# Patient Record
Sex: Female | Born: 2000 | Race: Black or African American | Hispanic: No | Marital: Single | Smoking: Never smoker
Health system: Southern US, Community
[De-identification: ages and names within clinical notes are randomized; demographics above are authoritative.]

## PROBLEM LIST (undated history)

## (undated) DIAGNOSIS — J45909 Unspecified asthma, uncomplicated: Secondary | ICD-10-CM

## (undated) DIAGNOSIS — F32A Depression, unspecified: Secondary | ICD-10-CM

## (undated) DIAGNOSIS — T7840XA Allergy, unspecified, initial encounter: Secondary | ICD-10-CM

## (undated) DIAGNOSIS — K219 Gastro-esophageal reflux disease without esophagitis: Secondary | ICD-10-CM

## (undated) DIAGNOSIS — R011 Cardiac murmur, unspecified: Secondary | ICD-10-CM

## (undated) DIAGNOSIS — F419 Anxiety disorder, unspecified: Secondary | ICD-10-CM

## (undated) HISTORY — DX: Cardiac murmur, unspecified: R01.1

## (undated) HISTORY — DX: Anxiety disorder, unspecified: F41.9

## (undated) HISTORY — DX: Gastro-esophageal reflux disease without esophagitis: K21.9

## (undated) HISTORY — DX: Depression, unspecified: F32.A

## (undated) HISTORY — DX: Allergy, unspecified, initial encounter: T78.40XA

## (undated) HISTORY — DX: Unspecified asthma, uncomplicated: J45.909

---
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
## 2022-02-08 ENCOUNTER — Encounter: Payer: Self-pay | Admitting: Emergency Medicine

## 2022-02-08 ENCOUNTER — Emergency Department
Admission: EM | Admit: 2022-02-08 | Discharge: 2022-02-08 | Disposition: A | Discharge status: Home / Self Care | Payer: Medicaid Other | Attending: Emergency Medicine | Admitting: Emergency Medicine

## 2022-02-08 DIAGNOSIS — N3001 Acute cystitis with hematuria: Secondary | ICD-10-CM | POA: Diagnosis not present

## 2022-02-08 DIAGNOSIS — R300 Dysuria: Secondary | ICD-10-CM | POA: Diagnosis present

## 2022-02-08 LAB — CHLAMYDIA/NGC RT PCR (ARMC ONLY)
Chlamydia Tr: NOT DETECTED
N gonorrhoeae: NOT DETECTED

## 2022-02-08 LAB — PREGNANCY, URINE: Preg Test, Ur: NEGATIVE

## 2022-02-08 LAB — URINALYSIS, ROUTINE W REFLEX MICROSCOPIC
Bilirubin Urine: NEGATIVE
Glucose, UA: NEGATIVE mg/dL
Ketones, ur: NEGATIVE mg/dL
Nitrite: NEGATIVE
Protein, ur: 100 mg/dL — AB
RBC / HPF: >50 RBC/hpf — ABNORMAL HIGH (ref 0–5)
Specific Gravity, Urine: 1.03 (ref 1.005–1.030)
WBC, UA: >50 WBC/hpf — ABNORMAL HIGH (ref 0–5)
pH: 6 (ref 5.0–8.0)

## 2022-02-08 LAB — POC URINE PREG, ED: Preg Test, Ur: NEGATIVE

## 2022-02-08 MED ORDER — CEPHALEXIN 500 MG PO CAPS
500.0000 mg | ORAL_CAPSULE | Freq: Once | ORAL | Status: AC
  Administered 2022-02-08: 500 mg via ORAL
  Filled 2022-02-08: qty 1

## 2022-02-08 MED ORDER — CEPHALEXIN 500 MG PO CAPS: 500.0000 mg | ORAL_CAPSULE | Freq: Four times a day (QID) | ORAL | 0 refills | Status: AC

## 2022-02-08 NOTE — ED Provider Notes (Signed)
[facility] EMERGENCY DEPARTMENT Provider Note   CSN: [id number] Arrival date & time: 02/08/22  1851     History  Chief Complaint  Patient presents with   Urinary Frequency    Nancy Bullock is a 21 y.o. female.  Presents to the emergency department evaluation of urinary frequency and dysuria.  Symptoms been present x1 day.  No vaginal discharge, fevers, nausea vomiting back pain or belly pain.  She has had a history of UTI in the past, has not been on any recent antibiotics.  She states her symptoms began today and symptoms are mild.  She describes pain with urination and increase in frequency  HPI     Home Medications Prior to Admission medications   Medication Sig Start Date End Date Taking? Authorizing Provider  cephALEXin (KEFLEX) 500 MG capsule Take 1 capsule (500 mg total) by mouth 4 (four) times daily for 5 days. 02/08/22 02/13/22 Yes Evon Slack, PA-C      Allergies    Patient has no known allergies.    Review of Systems   Review of Systems  Physical Exam Updated Vital Signs BP 130/85   Pulse 72   Temp 98.4 F (36.9 C) (Oral)   Resp 20   Ht 5\' 6"  (1.676 m)   Wt 73 kg   SpO2 98%   BMI 25.98 kg/m  Physical Exam Constitutional:      Appearance: She is well-developed.  HENT:     Head: Normocephalic and atraumatic.  Eyes:     Conjunctiva/sclera: Conjunctivae normal.  Cardiovascular:     Rate and Rhythm: Normal rate.  Pulmonary:     Effort: Pulmonary effort is normal. No respiratory distress.  Abdominal:     General: Abdomen is flat. There is no distension.     Tenderness: There is no abdominal tenderness. There is no right CVA tenderness, left CVA tenderness or guarding.  Musculoskeletal:        General: Normal range of motion.     Cervical back: Normal range of motion.  Skin:    General: Skin is warm.     Findings: No rash.  Neurological:     General: No focal deficit present.     Mental Status: She is alert and oriented  to person, place, and time. Mental status is at baseline.  Psychiatric:        Mood and Affect: Mood normal.        Behavior: Behavior normal.        Thought Content: Thought content normal.     ED Results / Procedures / Treatments   Labs (all labs ordered are listed, but only abnormal results are displayed) Labs Reviewed  URINALYSIS, ROUTINE W REFLEX MICROSCOPIC - Abnormal; Notable for the following components:      Result Value   Color, Urine YELLOW (*)    APPearance CLOUDY (*)    Hgb urine dipstick MODERATE (*)    Protein, ur 100 (*)    Leukocytes,Ua SMALL (*)    RBC / HPF >50 (*)    WBC, UA >50 (*)    Bacteria, UA RARE (*)    Non Squamous Epithelial PRESENT (*)    All other components within normal limits  CHLAMYDIA/NGC RT PCR ([facility] ONLY)            PREGNANCY, URINE  POC URINE PREG, ED    EKG None  Radiology No results found.  Procedures Procedures    Medications Ordered in ED Medications  cephALEXin (KEFLEX) capsule 500 mg (has no administration in time range)    ED Course/ Medical Decision Making/ A&P                           Medical Decision Making Risk Prescription drug management.   21 year old female with uncomplicated urinary tract infection.  No abdominal pain fevers or back pain.  No nausea or vomiting.  Vital signs are stable with no fever or tachycardia.  She is doing well.  No discomfort.  She will take antibiotics as prescribed and understands signs symptoms return to ER for. Final Clinical Impression(s) / ED Diagnoses Final diagnoses:  Acute cystitis with hematuria    Rx / DC Orders ED Discharge Orders          Ordered    cephALEXin (KEFLEX) 500 MG capsule  4 times daily        02/08/22 2103              Ronnette Juniper 02/08/22 2104    Georga Hacking, MD 02/09/22 [phone]

## 2022-02-08 NOTE — ED Triage Notes (Signed)
Pt presents via POV with complaints of urinary frequency and burning with urination that started today. Endorses bladder pain 8/10 that is worse when peeing. Pt denies hematuria or fevers.

## 2022-02-08 NOTE — ED Provider Triage Note (Signed)
Emergency Medicine Provider Triage Evaluation Note  Nancy Bullock , a 21 y.o. female  was evaluated in triage.  Pt complains of burning with urination. Also reports urinary urgency/frequency. No vaginal discharge. No abd pain or flank pain. No fever/chills. Reports not worried about STD  Review of Systems  Positive: dysuria Negative: Abd pain, fever, discharge   Physical Exam  Ht 5\' 6"  (1.676 m)   Wt 73 kg   BMI 25.98 kg/m  Gen:   Awake, no distress   Resp:  Normal effort  MSK:   Moves extremities without difficulty  Other:    Medical Decision Making  Medically screening exam initiated at 7:50 PM.  Appropriate orders placed.  Nancy Bullock was informed that the remainder of the evaluation will be completed by another provider, this initial triage assessment does not replace that evaluation, and the importance of remaining in the ED until their evaluation is complete.     , PA-C 02/08/22 1954

## 2022-02-08 NOTE — Discharge Instructions (Signed)
Please take antibiotics as prescribed.  Make sure you are drinking lots of fluids.  Return to the ER for any fevers increasing pain nausea vomiting back pain or any urgent changes in your health

## 2022-02-08 NOTE — ED Notes (Signed)
See triage note. Pt alert, denies fever, resp reg/unlabored, skin dry, sitting calmly on stretcher.

## 2022-08-31 IMAGING — DX DG KNEE COMPLETE 4+V*L*
4 series · 4 of 4 positions shown · non-contrast
Comparison: None.

CLINICAL DATA: Left knee pain

EXAM:
LEFT KNEE - COMPLETE 4+ VIEW

[knee ap]
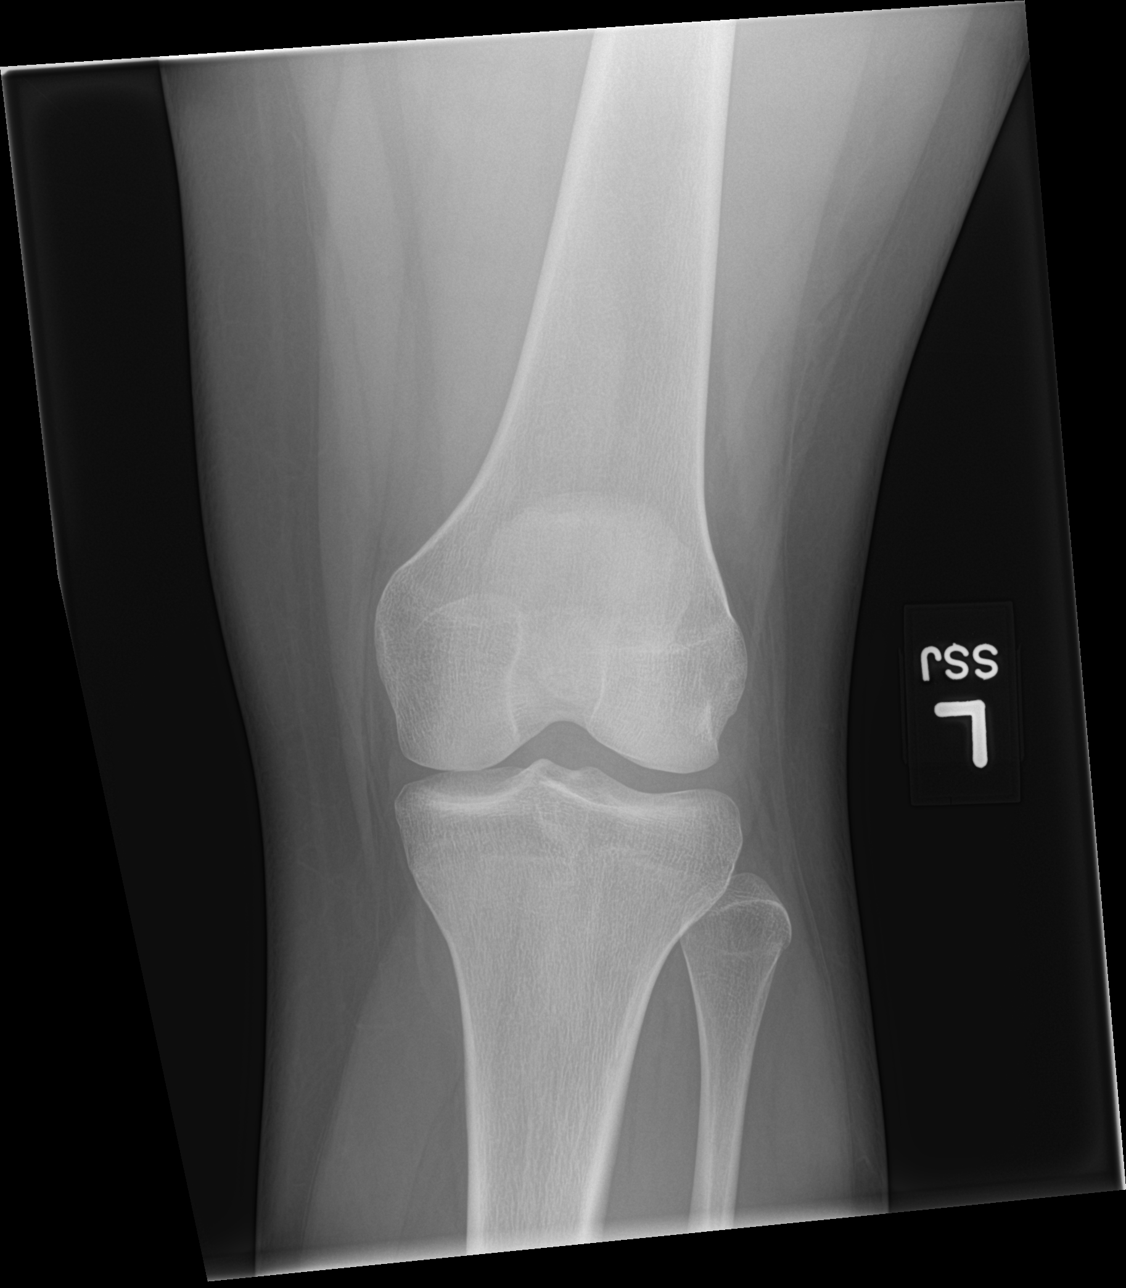

[knee lat]
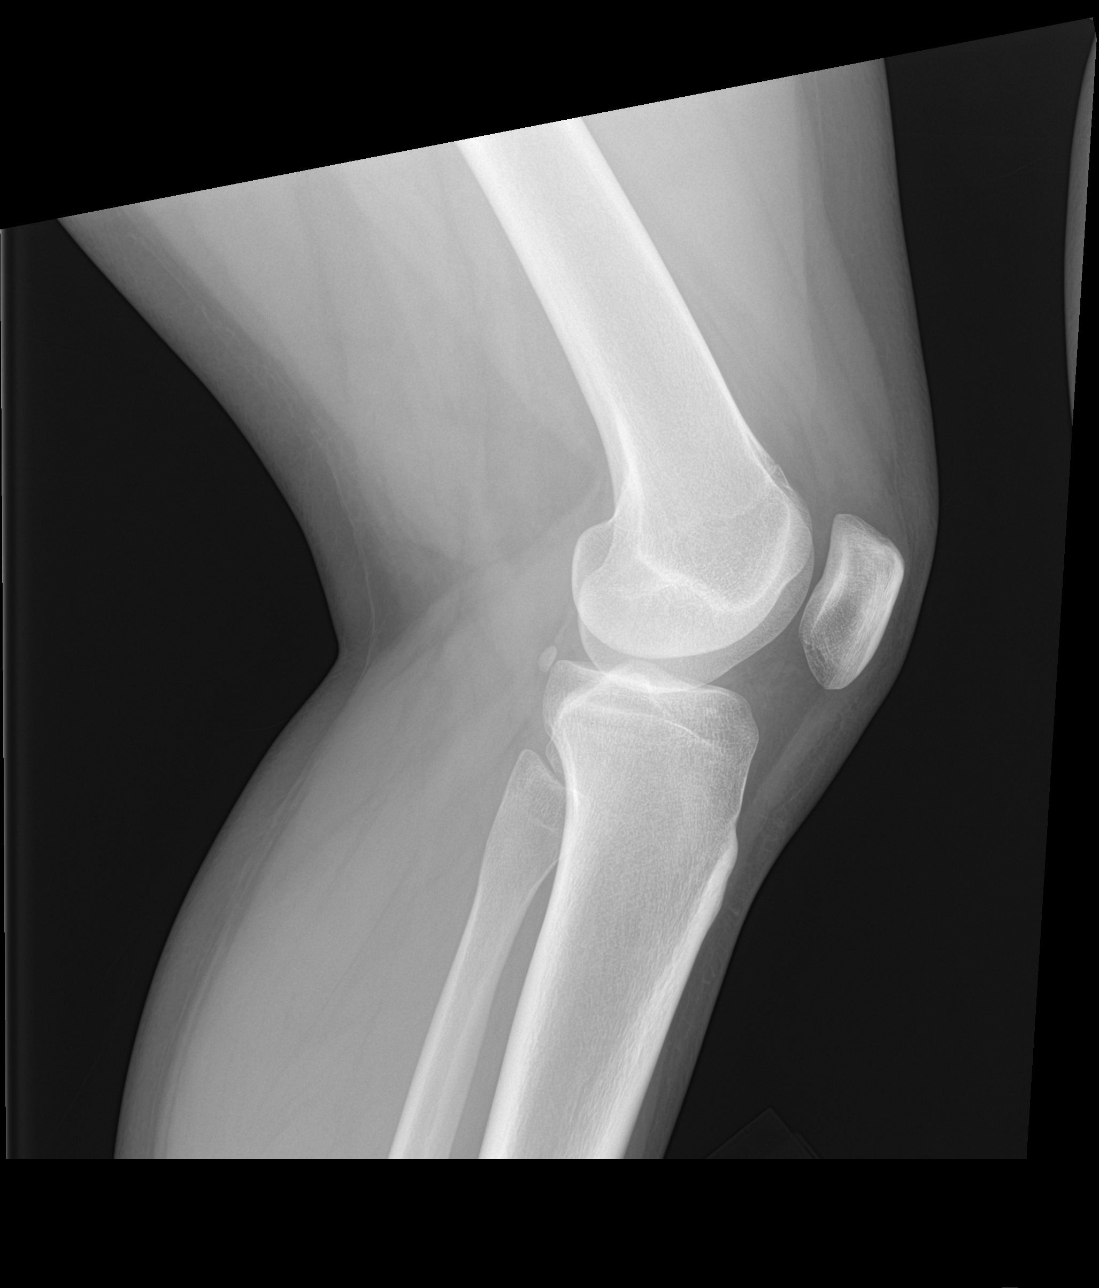

[knee obl (1 of 2)]
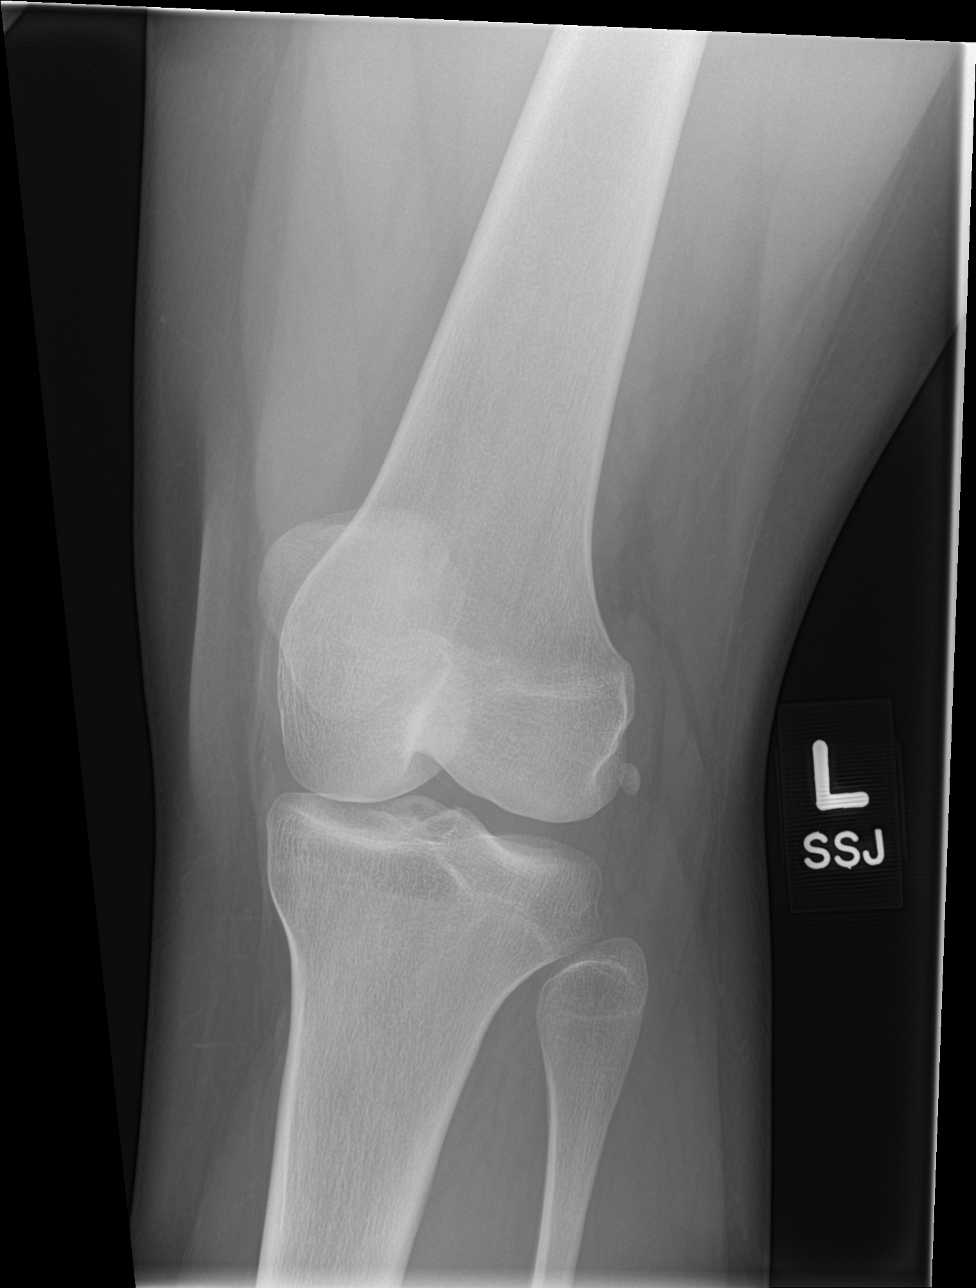

[knee obl (2 of 2)]
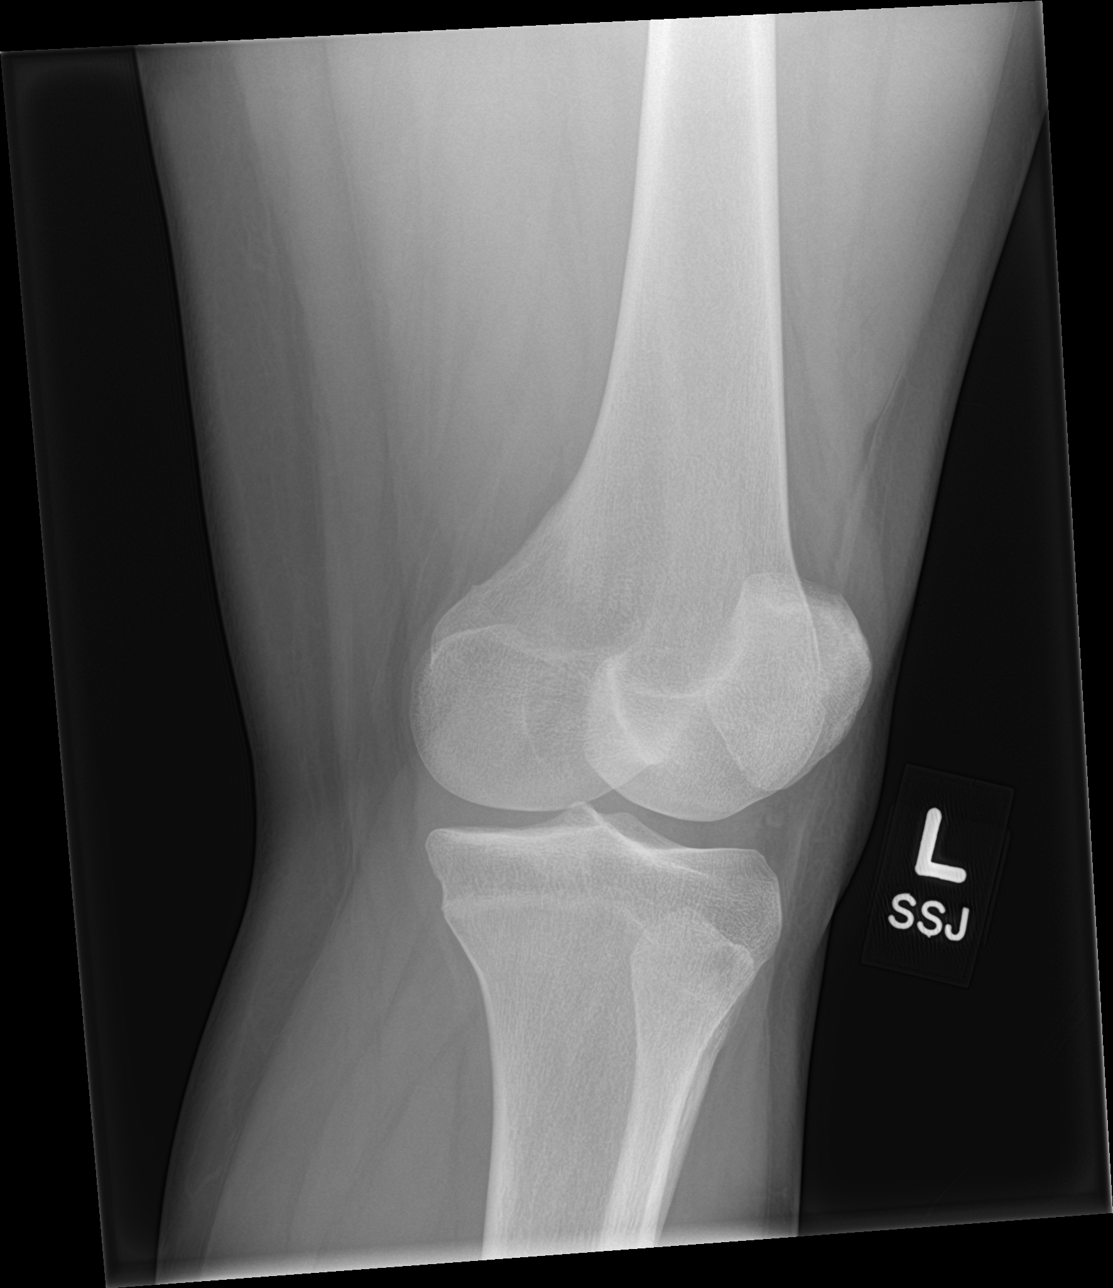

[4 of 4 positions shown; findings below may reference images not displayed]

FINDINGS: No evidence of fracture, dislocation, or joint effusion. No evidence
of arthropathy or other focal bone abnormality. Soft tissues are
unremarkable.
IMPRESSION: Negative.

## 2023-02-10 ENCOUNTER — Emergency Department
Admission: EM | Admit: 2023-02-10 | Discharge: 2023-02-10 | Disposition: A | Discharge status: Home / Self Care | Payer: Medicaid Other | Attending: Student | Admitting: Student

## 2023-02-10 ENCOUNTER — Other Ambulatory Visit: Payer: Self-pay

## 2023-02-10 DIAGNOSIS — Y93G3 Activity, cooking and baking: Secondary | ICD-10-CM | POA: Insufficient documentation

## 2023-02-10 DIAGNOSIS — W260XXA Contact with knife, initial encounter: Secondary | ICD-10-CM | POA: Insufficient documentation

## 2023-02-10 DIAGNOSIS — S61211A Laceration without foreign body of left index finger without damage to nail, initial encounter: Secondary | ICD-10-CM | POA: Insufficient documentation

## 2023-02-10 MED ORDER — LIDOCAINE HCL (PF) 1 % IJ SOLN
5.0000 mL | Freq: Once | INTRAMUSCULAR | Status: AC
  Administered 2023-02-10: 5 mL via INTRADERMAL
  Filled 2023-02-10: qty 5

## 2023-02-10 NOTE — ED Provider Notes (Signed)
[facility] Provider Note    None    (approximate)   History   Laceration (Patient was cutting onions and accidentally cut her LEFT finger with the knife; Bleeding controlled and wrapped in triage, CNS intact)   HPI  Nancy Bullock is a 22 y.o. female with no PMH who presents for evaluation of a laceration to her left index finger.  Patient was cutting an onion when the knife slipped cutting her finger.  Patient states she rinsed it under hot water.  She has pain with movement of the finger.  Last tetanus shot was within 5 years.      Physical Exam   Triage Vital Signs: ED Triage Vitals  Encounter Vitals Group     BP 02/10/23 2021 (!) 168/90     Systolic BP Percentile --      Diastolic BP Percentile --      Pulse Rate 02/10/23 2021 88     Resp 02/10/23 2021 19     Temp 02/10/23 2021 98.6 F (37 C)     Temp Source 02/10/23 2021 Oral     SpO2 02/10/23 2021 100 %     Weight 02/10/23 2022 200 lb (90.7 kg)     Height 02/10/23 2022 5\' 6"  (1.676 m)     Head Circumference --      Peak Flow --      Pain Score 02/10/23 2019 9     Pain Loc --      Pain Education --      Exclude from Growth Chart --     Most recent vital signs: Vitals:   02/10/23 2021  BP: (!) 168/90  Pulse: 88  Resp: 19  Temp: 98.6 F (37 C)  SpO2: 100%    General: Awake, no distress.  CV:  Good peripheral perfusion.  Resp:  Normal effort.  Abd:  No distention.  Other:  2 cm U-shaped laceration to the base of the left index finger on the radial side, bleeding controlled.  ROM of finger maintained.  Neurovascularly intact.   ED Results / Procedures / Treatments   Labs (all labs ordered are listed, but only abnormal results are displayed) Labs Reviewed - No data to display   PROCEDURES:  Critical Care performed: No  ..Laceration Repair  Date/Time: 02/10/2023 9:37 PM  Performed by: Cameron Ali, PA-C Authorized by: Cameron Ali, PA-C   Consent:     Consent obtained:  Verbal   Consent given by:  Patient   Risks discussed:  Infection, pain and poor cosmetic result   Alternatives discussed:  No treatment Anesthesia:    Anesthesia method:  Local infiltration   Local anesthetic:  Lidocaine 1% w/o epi Laceration details:    Location:  Finger   Finger location:  L index finger   Length (cm):  2   Depth (mm):  2 Pre-procedure details:    Preparation:  Patient was prepped and draped in usual sterile fashion Exploration:    Hemostasis achieved with:  Direct pressure Treatment:    Area cleansed with:  Povidone-iodine and saline   Amount of cleaning:  Standard   Irrigation solution:  Sterile saline   Irrigation volume:  20 mL   Irrigation method:  Syringe Skin repair:    Repair method:  Sutures   Suture size:  5-0   Suture material:  Nylon   Suture technique:  Simple interrupted   Number of sutures:  3 Approximation:    Approximation:  Close Repair type:    Repair type:  Simple Post-procedure details:    Dressing:  Adhesive bandage   Procedure completion:  Tolerated well, no immediate complications    MEDICATIONS ORDERED IN ED: Medications  lidocaine (PF) (XYLOCAINE) 1 % injection 5 mL (5 mLs Intradermal Given by Other 02/10/23 2106)     IMPRESSION / MDM / ASSESSMENT AND PLAN / ED COURSE  I reviewed the triage vital signs and the nursing notes.                             22 year old female with laceration to the left index finger.  BP was elevated in triage, otherwise VSS.  NAD on exam.  Differential diagnosis includes, but is not limited to, laceration, tendon injury.  Patient's presentation is most consistent with acute, uncomplicated illness.  Laceration repaired as described in the procedure note above.  I advised patient on wound care.  She can take Tylenol and ibuprofen as needed for pain.  We discussed signs of infection to look for.  Patient voiced understanding, all questions were answered and she stable at  discharge.    FINAL CLINICAL IMPRESSION(S) / ED DIAGNOSES   Final diagnoses:  Laceration of left index finger without foreign body without damage to nail, initial encounter     Rx / DC Orders   ED Discharge Orders     None        Note:  This document was prepared using Dragon voice recognition software and may include unintentional dictation errors.   Cameron Ali, PA-C 02/10/23 2140    Minna Antis, MD 02/10/23 [phone]

## 2023-02-10 NOTE — ED Triage Notes (Signed)
Patient was cutting onions and accidentally cut her LEFT finger with the knife; Bleeding controlled and wrapped in triage, CNS intact

## 2023-02-10 NOTE — Discharge Instructions (Addendum)
You will need to have the stitches removed in 7 to 10 days, this can be done in the ED, by your primary care provider or by urgent care.  Please read the attached information about laceration care.  You should wash the wound with soap and water daily and change her bandage daily.  Please keep it covered with a Band-Aid until you have the stitches removed.  Watch for signs of infection including warmth, redness, swelling and purulent drainage.  Should this occur please be seen by a medical provider.  You can take 650 mg of Tylenol and 600 mg of ibuprofen every 6 hours as needed for pain.

## 2023-05-13 ENCOUNTER — Ambulatory Visit: Payer: Medicaid Other | Admitting: Physician Assistant

## 2023-05-13 ENCOUNTER — Encounter: Payer: Self-pay | Admitting: Physician Assistant

## 2023-05-13 VITALS — BP 124/89 | HR 80 | Temp 98.6°F | Ht 66.0 in | Wt 200.0 lb

## 2023-05-13 DIAGNOSIS — F419 Anxiety disorder, unspecified: Secondary | ICD-10-CM

## 2023-05-13 DIAGNOSIS — E669 Obesity, unspecified: Secondary | ICD-10-CM

## 2023-05-13 DIAGNOSIS — K219 Gastro-esophageal reflux disease without esophagitis: Secondary | ICD-10-CM

## 2023-05-13 DIAGNOSIS — N926 Irregular menstruation, unspecified: Secondary | ICD-10-CM

## 2023-05-13 DIAGNOSIS — Z7689 Persons encountering health services in other specified circumstances: Secondary | ICD-10-CM

## 2023-05-13 DIAGNOSIS — Z833 Family history of diabetes mellitus: Secondary | ICD-10-CM

## 2023-05-13 DIAGNOSIS — F32A Depression, unspecified: Secondary | ICD-10-CM | POA: Diagnosis not present

## 2023-05-13 MED ORDER — OMEPRAZOLE 20 MG PO CPDR: 20.0000 mg | DELAYED_RELEASE_CAPSULE | Freq: Every day | ORAL | 3 refills | Status: AC

## 2023-05-13 MED ORDER — FLUOXETINE HCL 20 MG PO CAPS: 20.0000 mg | ORAL_CAPSULE | Freq: Every day | ORAL | 3 refills | Status: AC
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
## 2023-05-15 ENCOUNTER — Ambulatory Visit: Payer: Medicaid Other | Admitting: Certified Nurse Midwife

## 2023-05-15 ENCOUNTER — Encounter: Payer: Self-pay | Admitting: Certified Nurse Midwife

## 2023-05-15 VITALS — BP 116/76 | HR 81 | Ht 66.0 in | Wt 200.7 lb

## 2023-05-15 DIAGNOSIS — N926 Irregular menstruation, unspecified: Secondary | ICD-10-CM | POA: Diagnosis not present

## 2023-05-15 DIAGNOSIS — Z3009 Encounter for other general counseling and advice on contraception: Secondary | ICD-10-CM

## 2023-05-15 DIAGNOSIS — N939 Abnormal uterine and vaginal bleeding, unspecified: Secondary | ICD-10-CM

## 2023-05-15 LAB — POCT URINE PREGNANCY: Preg Test, Ur: NEGATIVE

## 2023-05-15 NOTE — Progress Notes (Signed)
GYN ENCOUNTER NOTE  Subjective:       Nancy Bullock is a 22 y.o. No obstetric history on file. female is here for gynecologic evaluation of the following issues:  1. Irregular cycles.  Pt state she has long periods lasting up to 15 days and she sometimes has 2 periods a month. She notes that she will pass penny size clots on occasion. She denies that they are painful.    Gynecologic History No LMP recorded. Contraception: none Last Pap: n/a.  Last mammogram: n/a   Obstetric History OB History  No obstetric history on file.    Past Medical History:  Diagnosis Date   Allergy    Anxiety 2021   Asthma    had it [facility] [employer] [facility] but jot as much recently   Depression 2021   GERD (gastroesophageal reflux disease)    ever since i was a kid   Heart murmur    had it when i was a baby    No past surgical history on file.  Current Outpatient Medications on File Prior to Visit  Medication Sig Dispense Refill   FLUoxetine (PROZAC) 20 MG capsule Take 1 capsule (20 mg total) by mouth daily. 30 capsule 3   omeprazole (PRILOSEC) 20 MG capsule Take 1 capsule (20 mg total) by mouth daily. 30 capsule 3   No current facility-administered medications on file prior to visit.    Allergies  Allergen Reactions   Coconut (Cocos Nucifera) Anaphylaxis   Charcoal Dermatitis    Social History   Socioeconomic History   Marital status: Single    Spouse name: Not on file   Number of children: Not on file   Years of education: Not on file   Highest education level: 11th grade  Occupational History   Not on file  Tobacco Use   Smoking status: Never   Smokeless tobacco: Never  Substance and Sexual Activity   Alcohol use: Not Currently   Drug use: Never   Sexual activity: Yes    Birth control/protection: None    Comment: need a new one  Other Topics Concern   Not on file  Social History Narrative   Not on file   Social Determinants of Health   Financial Resource  Strain: Low Risk  (05/09/2023)   Overall Financial Resource Strain (CARDIA)    Difficulty of Paying Living Expenses: Not very hard  Food Insecurity: No Food Insecurity (05/13/2023)   Hunger Vital Sign    Worried About Running Out of Food in the Last Year: Never true    Ran Out of Food in the Last Year: Never true  Transportation Needs: No Transportation Needs (05/09/2023)   PRAPARE - Administrator, Civil Service (Medical): No    Lack of Transportation (Non-Medical): No  Physical Activity: Inactive (05/09/2023)   Exercise Vital Sign    Days of Exercise per Week: 1 day    Minutes of Exercise per Session: 0 min  Stress: Stress Concern Present (05/09/2023)   [employer] of Occupational Health - Occupational Stress Questionnaire    Feeling of Stress : Very much  Social Connections: Moderately Isolated (05/09/2023)   Social Connection and Isolation Panel [NHANES]    Frequency of Communication with Friends and Family: More than three times a week    Frequency of Social Gatherings with Friends and Family: Three times a week    Attends Religious Services: Never    Active Member of Clubs or Organizations: No  Attends Banker Meetings: Not on file    Marital Status: Living with partner  Intimate Partner Violence: Not on file    Family History  Problem Relation Age of Onset   Anxiety disorder Mother    Depression Mother     The following portions of the patient's history were reviewed and updated as appropriate: allergies, current medications, past family history, past medical history, past social history, past surgical history and problem list.  Review of Systems Review of Systems - Negative except as mentioned in HPI Review of Systems - General ROS: negative for - chills, fatigue, fever, hot flashes, malaise or night sweats Hematological and Lymphatic ROS: negative for - bleeding problems or swollen lymph nodes Gastrointestinal ROS: negative for -  abdominal pain, blood in stools, change in bowel habits and nausea/vomiting Musculoskeletal ROS: negative for - joint pain, muscle pain or muscular weakness Genito-Urinary ROS: negative for - change in menstrual cycle, dysmenorrhea, dyspareunia, dysuria, genital discharge, genital ulcers, hematuria, incontinence, i, nocturia or pelvic pain. Positive for rregular/heavy menses Objective:   BP 116/76   Pulse 81   Ht 5\' 6"  (1.676 m)   Wt 200 lb 11.2 oz (91 kg)   BMI 32.39 kg/m  CONSTITUTIONAL: Well-developed, well-nourished female in no acute distress.  HENT:  Normocephalic, atraumatic.  NECK: Normal range of motion, supple, no masses.  Normal thyroid.  SKIN: Skin is warm and dry. No rash noted. Not diaphoretic. No erythema. No pallor. NEUROLGIC: Alert and oriented to person, place, and time. PSYCHIATRIC: Normal mood and affect. Normal behavior. Normal judgment and thought content. CARDIOVASCULAR:Not Examined RESPIRATORY: Not Examined BREASTS: Not Examined ABDOMEN: Soft, non distended; Non tender.  No Organomegaly. PELVIC: not indicated  MUSCULOSKELETAL: Normal range of motion. No tenderness.  No cyanosis, clubbing, or edema.     Assessment:   Abnormal uterine bleeding   Plan:   Discussed possible cause of irregular heavy bleeding. Discussed options for treatment.Encouraged weight loss to help regulate cycle.  She is interested in starting back on depo provera injections. She has used in the past. She denies any contraindications to use. She admits to having unprotected intercourse yesterday. Discussed follow up  1 wk for beta quant , if negative will return for nurse visit depo injections.   Doreene Burke, CNM

## 2023-05-22 ENCOUNTER — Other Ambulatory Visit: Payer: Medicaid Other

## 2023-05-22 DIAGNOSIS — Z3009 Encounter for other general counseling and advice on contraception: Secondary | ICD-10-CM

## 2023-05-22 DIAGNOSIS — H5213 Myopia, bilateral: Secondary | ICD-10-CM | POA: Diagnosis not present

## 2023-05-23 ENCOUNTER — Telehealth: Payer: Self-pay | Admitting: Physician Assistant

## 2023-05-23 NOTE — Telephone Encounter (Signed)
Pt is calling in because she says she saw Debera Lat and per pt [place] told her that she would provide a letter for her emotional support animal. Pt says she hasn't received the letter and has sent a message on MyChart but hasn't heard anything back. Pt wants to know is she still able to get the letter and can it still be sent through MyChart? Please advise.

## 2023-05-24 ENCOUNTER — Ambulatory Visit: Payer: Medicaid Other | Admitting: Physician Assistant

## 2023-05-24 ENCOUNTER — Other Ambulatory Visit: Payer: Self-pay | Admitting: Certified Nurse Midwife

## 2023-05-24 ENCOUNTER — Encounter: Payer: Self-pay | Admitting: Physician Assistant

## 2023-05-24 LAB — BETA HCG QUANT (REF LAB): hCG Quant: <1 m[IU]/mL

## 2023-05-24 MED ORDER — MEDROXYPROGESTERONE ACETATE 150 MG/ML IM SUSP: 150.0000 mg | Freq: Once | INTRAMUSCULAR | 3 refills | Status: AC

## 2023-06-07 ENCOUNTER — Ambulatory Visit: Payer: Medicaid Other

## 2023-06-07 NOTE — Progress Notes (Deleted)
    NURSE VISIT NOTE  Subjective:    Patient ID: Nancy Bullock, female    DOB: 2000/10/21, 22 y.o.   MRN: [id number]  HPI  Patient is a 22 y.o. No obstetric history on file. female who presents for depo provera injection.   Objective:    There were no vitals taken for this visit.  Last Annual: N/A. Last pap: N/A. Last Depo-Provera: per Donette Larry on 05/15/23 patient was seen for office visit and counseled,patient advised to return in one week for Beta HCG and if negative can schedule appt for Depo injection under nurse visit.. Side Effects if any: none. Serum HCG indicated? Yes . Done 05/22/23 Depo-Provera 150 mg IM given by: Sheliah Hatch, CMA. Site: {[facility] INJ [id number]  Lab Review  @THIS  VISIT ONLY@  Assessment:   No diagnosis found.   Plan:   Next appointment due between 08/23/23 and 09/06/23.    Fonda Kinder, CMA

## 2023-06-15 NOTE — Progress Notes (Signed)
  CPE Patient was not seen for appt d/t no call, no show, or late arrival >10 mins past appt time.    Nancy Lat PA [facility] [address] [place], [place] [address] [phone] (phone) [phone] (fax) [facility]

## 2023-06-17 ENCOUNTER — Ambulatory Visit (INDEPENDENT_AMBULATORY_CARE_PROVIDER_SITE_OTHER): Payer: Medicaid Other | Admitting: Physician Assistant

## 2023-06-17 DIAGNOSIS — Z0000 Encounter for general adult medical examination without abnormal findings: Secondary | ICD-10-CM

## 2023-06-17 DIAGNOSIS — E669 Obesity, unspecified: Secondary | ICD-10-CM

## 2023-06-17 DIAGNOSIS — Z91199 Patient's noncompliance with other medical treatment and regimen due to unspecified reason: Secondary | ICD-10-CM

## 2023-07-08 ENCOUNTER — Other Ambulatory Visit: Payer: Self-pay

## 2023-07-08 ENCOUNTER — Emergency Department
Admission: EM | Admit: 2023-07-08 | Discharge: 2023-07-08 | Disposition: A | Discharge status: Home / Self Care | Payer: Medicaid Other | Attending: Emergency Medicine | Admitting: Emergency Medicine

## 2023-07-08 DIAGNOSIS — U071 COVID-19: Secondary | ICD-10-CM | POA: Insufficient documentation

## 2023-07-08 DIAGNOSIS — R509 Fever, unspecified: Secondary | ICD-10-CM | POA: Diagnosis present

## 2023-07-08 LAB — RESP PANEL BY RT-PCR (RSV, FLU A&B, COVID)  RVPGX2
Influenza A by PCR: NEGATIVE
Influenza B by PCR: NEGATIVE
Resp Syncytial Virus by PCR: NEGATIVE
SARS Coronavirus 2 by RT PCR: POSITIVE — AB

## 2023-07-08 NOTE — Discharge Instructions (Addendum)
Drink plenty of fluids, Tylenol and ibuprofen for fever/pain as needed. Since you are not vaccinated you will need to remain out of work for 1 week.  At that time you may return to work without any restrictions. Be aware that we do not fill out FMLA forms if needed.  You would need to see regular doctor for these forms to be filled out.

## 2023-07-08 NOTE — ED Triage Notes (Signed)
Pt comes with c/o flu like symptoms. Pt states this started few days. Pt states vomiting, nausea and body aches.

## 2023-07-08 NOTE — ED Provider Notes (Signed)
[facility] Provider Note    Event Date/Time   First MD Initiated Contact with Patient 07/08/23 1741     (approximate)   History   Fever   HPI  Nancy Bullock is a 22 y.o. female with no significant past medical history presents emergency department flulike symptoms.  Patient states started few days ago.  Some vomiting and nausea along with bodyaches.  Sore throat.  No vomiting today.  States her work wanted her to go ahead and get checked.      Physical Exam   Triage Vital Signs: ED Triage Vitals  Encounter Vitals Group     BP 07/08/23 1745 (!) 134/93     Systolic BP Percentile --      Diastolic BP Percentile --      Pulse Rate 07/08/23 1745 82     Resp 07/08/23 1745 20     Temp 07/08/23 1745 98.8 F (37.1 C)     Temp Source 07/08/23 1745 Oral     SpO2 07/08/23 1745 98 %     Weight 07/08/23 1638 200 lb (90.7 kg)     Height 07/08/23 1638 5\' 6"  (1.676 m)     Head Circumference --      Peak Flow --      Pain Score 07/08/23 1638 5     Pain Loc --      Pain Education --      Exclude from Growth Chart --     Most recent vital signs: Vitals:   07/08/23 1745  BP: (!) 134/93  Pulse: 82  Resp: 20  Temp: 98.8 F (37.1 C)  SpO2: 98%     General: Awake, no distress.   CV:  Good peripheral perfusion. regular rate and  rhythm Resp:  Normal effort. Lungs cta Abd:  No distention.   Other:     ED Results / Procedures / Treatments   Labs (all labs ordered are listed, but only abnormal results are displayed) Labs Reviewed  RESP PANEL BY RT-PCR (RSV, FLU A&B, COVID)  [id number] - Abnormal; Notable for the following components:      Result Value   SARS Coronavirus 2 by RT PCR POSITIVE (*)    All other components within normal limits     EKG     RADIOLOGY     PROCEDURES:   Procedures   MEDICATIONS ORDERED IN ED: Medications - No data to display   IMPRESSION / MDM / ASSESSMENT AND PLAN / ED COURSE  I reviewed the triage  vital signs and the nursing notes.                              Differential diagnosis includes, but is not limited to, COVID, influenza, RSV, viral illness  Patient's presentation is most consistent with acute illness / injury with system symptoms.   Patient's respiratory panel is positive for COVID  I did explain these findings to the patient.  She is unvaccinated for COVID.  Explained to her that therefore we will need to keep her out for 1 week.  At that time she may return to work without restrictions.  Work note was provided.  Also explained to her that we do not fill out FMLA forms if needed she will need to go to her regular doctor.  She is in agreement treatment plan.  Discharged stable condition.      FINAL CLINICAL IMPRESSION(S) / ED DIAGNOSES  Final diagnoses:  COVID     Rx / DC Orders   ED Discharge Orders     None        Note:  This document was prepared using Dragon voice recognition software and may include unintentional dictation errors.    Faythe Ghee, PA-C 07/08/23 1748    Minna Antis, MD 07/08/23 2237

## 2023-09-26 ENCOUNTER — Ambulatory Visit: Payer: Self-pay | Admitting: Physician Assistant

## 2023-09-26 NOTE — Telephone Encounter (Signed)
 Copied From CRM [id number]. Reason for Triage: nausea and vomiting with abdominal cramping, can't keep food down but is able to drink but it makes her feel nauseous- [phone]   Chief Complaint: N/V, stomach cramps Symptoms: see above  Frequency: comes and goes but gets nauseous with water Pertinent Negatives: Patient denies cp, fever, sob Disposition: [] ED /[] Urgent Care (no appt availability in office) / [x] Appointment(In office/virtual)/ []  [facility] [place] [facility]/ [] Home Care/ [] Refused Recommended Disposition /[] [place] [facility]/ []  Follow-up with PCP Additional Notes: per protocol patient to be seen within 24 hours. Instructed to go to UC.  Pcp office updated; care advice given, denies questions, instructed to go to er if becomes worse  Reason for Disposition  [1] MILD or MODERATE vomiting AND [2] present > 48 hours (2 days) (Exception: Mild vomiting with associated diarrhea.)  Answer Assessment - Initial Assessment Questions 1. VOMITING SEVERITY: "How many times have you vomited in the past 24 hours?"     - MILD:  1 - 2 times/day    - MODERATE: 3 - 5 times/day, decreased oral intake without significant weight loss or symptoms of dehydration    - SEVERE: 6 or more times/day, vomits everything or nearly everything, with significant weight loss, symptoms of dehydration      None in the last 24 hours but is nauseous 2. ONSET: "When did the vomiting begin?"     Sunday 3. FLUIDS: "What fluids or food have you vomited up today?" "Have you been able to keep any fluids down?"     denies 4. ABDOMEN PAIN: "Are your having any abdomen pain?" If Yes : "How bad is it and what does it feel like?" (e.g., crampy, dull, intermittent, constant)      Yes, cramping 5. DIARRHEA: "Is there any diarrhea?" If Yes, ask: "How many times today?"      denies 6. CONTACTS: "Is there anyone else in the family with the same symptoms?"      denies 7. CAUSE: "What do you think is causing your vomiting?"      unknown 8. HYDRATION STATUS: "Any signs of dehydration?" (e.g., dry mouth [not only dry lips], too weak to stand) "When did you last urinate?"     denies 9. OTHER SYMPTOMS: "Do you have any other symptoms?" (e.g., fever, headache, vertigo, vomiting blood or coffee grounds, recent head injury)     denies 10. PREGNANCY: "Is there any chance you are pregnant?" "When was your last menstrual period?"       I have taken tests and they have been negative.  Protocols used: Vomiting-A-AH

## 2023-12-08 ENCOUNTER — Emergency Department

## 2023-12-08 ENCOUNTER — Emergency Department
Admission: EM | Admit: 2023-12-08 | Discharge: 2023-12-08 | Disposition: A | Discharge status: Home / Self Care | Attending: Emergency Medicine | Admitting: Emergency Medicine

## 2023-12-08 ENCOUNTER — Other Ambulatory Visit: Payer: Self-pay

## 2023-12-08 DIAGNOSIS — R519 Headache, unspecified: Secondary | ICD-10-CM | POA: Diagnosis not present

## 2023-12-08 DIAGNOSIS — R42 Dizziness and giddiness: Secondary | ICD-10-CM | POA: Insufficient documentation

## 2023-12-08 DIAGNOSIS — R110 Nausea: Secondary | ICD-10-CM | POA: Insufficient documentation

## 2023-12-08 DIAGNOSIS — Y92410 Unspecified street and highway as the place of occurrence of the external cause: Secondary | ICD-10-CM | POA: Diagnosis not present

## 2023-12-08 DIAGNOSIS — M79602 Pain in left arm: Secondary | ICD-10-CM | POA: Diagnosis not present

## 2023-12-08 DIAGNOSIS — M79622 Pain in left upper arm: Secondary | ICD-10-CM | POA: Insufficient documentation

## 2023-12-08 MED ORDER — CYCLOBENZAPRINE HCL 5 MG PO TABS: 5.0000 mg | ORAL_TABLET | Freq: Three times a day (TID) | ORAL | 0 refills | Status: AC | PRN

## 2023-12-08 MED ORDER — CYCLOBENZAPRINE HCL 10 MG PO TABS
5.0000 mg | ORAL_TABLET | Freq: Once | ORAL | Status: AC
  Administered 2023-12-08: 5 mg via ORAL
  Filled 2023-12-08: qty 1

## 2023-12-08 MED ORDER — KETOROLAC TROMETHAMINE 15 MG/ML IJ SOLN
15.0000 mg | Freq: Once | INTRAMUSCULAR | Status: AC
  Administered 2023-12-08: 15 mg via INTRAMUSCULAR
  Filled 2023-12-08: qty 1

## 2023-12-08 MED ORDER — ACETAMINOPHEN 325 MG PO TABS
650.0000 mg | ORAL_TABLET | Freq: Once | ORAL | Status: AC
  Administered 2023-12-08: 650 mg via ORAL
  Filled 2023-12-08: qty 2
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
## 2024-03-03 ENCOUNTER — Emergency Department: Admission: EM | Admit: 2024-03-03 | Discharge: 2024-03-03 | Disposition: A | Discharge status: Home / Self Care

## 2024-03-03 ENCOUNTER — Other Ambulatory Visit: Payer: Self-pay

## 2024-03-03 DIAGNOSIS — S61312A Laceration without foreign body of right middle finger with damage to nail, initial encounter: Secondary | ICD-10-CM | POA: Insufficient documentation

## 2024-03-03 DIAGNOSIS — W208XXA Other cause of strike by thrown, projected or falling object, initial encounter: Secondary | ICD-10-CM | POA: Insufficient documentation

## 2024-03-03 DIAGNOSIS — S61303A Unspecified open wound of left middle finger with damage to nail, initial encounter: Secondary | ICD-10-CM | POA: Insufficient documentation

## 2024-03-03 DIAGNOSIS — S61309A Unspecified open wound of unspecified finger with damage to nail, initial encounter: Secondary | ICD-10-CM

## 2024-03-03 DIAGNOSIS — S6992XA Unspecified injury of left wrist, hand and finger(s), initial encounter: Secondary | ICD-10-CM | POA: Diagnosis present

## 2024-03-03 MED ORDER — LIDOCAINE HCL (PF) 1 % IJ SOLN
5.0000 mL | Freq: Once | INTRAMUSCULAR | Status: AC
  Administered 2024-03-03: 5 mL
  Filled 2024-03-03: qty 5

## 2024-03-03 NOTE — ED Provider Notes (Signed)
 [facility] Emergency Department Provider Note     Event Date/Time   First MD Initiated Contact with Patient 03/03/24 2138     (approximate)   History   Finger Injury   HPI  Nancy Bullock is a 23 y.o. female with a noncontributory medical history, presents to the ED endorsing a left middle finger nail injury.  Patient presents with a distal nail edge laceration and partial nail avulsion after a TV stand fell on a nail.  Injury is complicated by an acrylic overlay and a gold tone nail ornament.  No other injury reported at this time.  Patient notes current tetanus status.  Physical Exam   Triage Vital Signs: ED Triage Vitals  Encounter Vitals Group     BP 03/03/24 2040 (!) 154/90     Girls Systolic BP Percentile --      Girls Diastolic BP Percentile --      Boys Systolic BP Percentile --      Boys Diastolic BP Percentile --      Pulse Rate 03/03/24 2040 89     Resp 03/03/24 2040 18     Temp 03/03/24 2040 98.4 F (36.9 C)     Temp Source 03/03/24 2040 Oral     SpO2 03/03/24 2040 100 %     Weight 03/03/24 2039 200 lb (90.7 kg)     Height 03/03/24 2039 5' 6 (1.676 m)     Head Circumference --      Peak Flow --      Pain Score 03/03/24 2039 10     Pain Loc --      Pain Education --      Exclude from Growth Chart --     Most recent vital signs: Vitals:   03/03/24 2040  BP: (!) 154/90  Pulse: 89  Resp: 18  Temp: 98.4 F (36.9 C)  SpO2: 100%    General Awake, no distress.  HEENT NCAT. PERRL. EOMI. No rhinorrhea. Mucous membranes are moist.  CV:  Good peripheral perfusion.  RESP:  Normal effort.  MSK:  Right middle finger with a distal nail laceration.  Patient with acrylic overlay in decorative ornament on her nail, presents with a distal nail edge fracture and presumed nailbed injury.  No evidence of a subungual hemorrhage noted.  Normal composite fist appreciated.   ED Results / Procedures / Treatments   Labs (all labs ordered  are listed, but only abnormal results are displayed) Labs Reviewed - No data to display   EKG   RADIOLOGY  No results found.   PROCEDURES:  Critical Care performed: No  Nail Removal  Date/Time: 03/03/2024 10:18 PM  Performed by: Loyd Candida LULLA Aldona, PA-C Authorized by: Loyd Candida LULLA Aldona, PA-C   Consent:    Consent obtained:  Verbal   Consent given by:  Patient   Risks, benefits, and alternatives were discussed: yes     Risks discussed:  Bleeding and pain Universal protocol:    Site/side marked: yes     Patient identity confirmed:  Verbally with patient Pre-procedure details:    Skin preparation:  Alcohol Procedure details:    Location:  Hand   Hand location:  L long finger Anesthesia:    Anesthesia method:  Nerve block   Block needle gauge:  27 G   Block anesthetic:  Lidocaine  1% w/o epi   Block technique:  Transthecal block   Block injection procedure:  Anatomic landmarks palpated, introduced needle, negative aspiration  for blood and incremental injection   Block outcome:  Anesthesia achieved Nail Removal:    Nail removed:  Partial   Nail removed location: Distal.   Nail bed repaired: no     Removed nail replaced and anchored: no   Trephination:    Subungual hematoma drained: no   Ingrown nail:    Wedge excision of skin: no     Nail matrix removed or ablated:  None Post-procedure details:    Dressing:  Petrolatum-impregnated gauze and splint   Procedure completion:  Tolerated well, no immediate complications    MEDICATIONS ORDERED IN ED: Medications  lidocaine  (PF) (XYLOCAINE ) 1 % injection 5 mL (5 mLs Infiltration Given by Other 03/03/24 2240)     IMPRESSION / MDM / ASSESSMENT AND PLAN / ED COURSE  I reviewed the triage vital signs and the nursing notes.                              Differential diagnosis includes, but is not limited to, nail avulsion, subungual hemorrhage, finger contusion  Patient's presentation is most consistent with  acute, uncomplicated illness.  Patient's diagnosis is consistent with accidental nail avulsion with distal nailbed injury.  Patient presents with acute nail injury after local trauma.  She consents to nail removal, which is performed after adequate anesthesia is achieved.  The distal portion of the nail is removed without difficulty.  Patient discharged with wound care supplies and instructions. Patient is to follow up with her PCP as needed or otherwise directed. Patient is given ED precautions to return to the ED for any worsening or new symptoms.   FINAL CLINICAL IMPRESSION(S) / ED DIAGNOSES   Final diagnoses:  Nail avulsion, finger, initial encounter     Rx / DC Orders   ED Discharge Orders     None        Note:  This document was prepared using Dragon voice recognition software and may include unintentional dictation errors.    Loyd Candida LULLA Aldona, PA-C 03/03/24 2306    Nicholaus Rolland BRAVO, MD 03/03/24 [phone]

## 2024-03-03 NOTE — Discharge Instructions (Signed)
 Keep wound clean, dry, and covered with the supplies provided.  Follow-up with your primary provider or return to the ED if needed.

## 2024-03-03 NOTE — ED Triage Notes (Signed)
 Pt reports she dropped an electric fireplace stand on her left middle finger, pt has on acrylic nails. Pts nail is split in half, bleeding noted.

## 2024-05-28 ENCOUNTER — Ambulatory Visit: Payer: Self-pay

## 2024-05-28 NOTE — Telephone Encounter (Signed)
 FYI Only or Action Required?: FYI only for provider: ED advised.  Patient was last seen in primary care on 05/13/2023 by Ostwalt, Janna, PA-C.  Called Nurse Triage reporting Dizziness.  Symptoms began several months ago.  Interventions attempted: Rest, hydration, or home remedies.  Symptoms are: gradually worsening.  Triage Disposition: Go to ED Now (or PCP Triage)  Patient/caregiver understands and will follow disposition?: Yes  Copied from CRM #[id number]. Topic: Clinical - Red Word Triage >> May 28, 2024 10:33 AM Rosaria BRAVO wrote: Red Word that prompted transfer to Nurse Triage: Increased dizziness, blackouts (2-3 a week for the last two weeks), swelling in both feet, and blood pressure low Reason for Disposition  SEVERE dizziness (e.g., unable to stand, requires support to walk, feels like passing out now)  Answer Assessment - Initial Assessment Questions Patient having episodes of dizziness for the last 3-37months. In the past 2-3 weeks has increased to 3-4x a week. Gets light headed, energy drains out of her, starts to feel cold, and starts to black out, episodes last 2-63minutes. Sometimes she faints completely, and sometime she is able to pull herself out of it. Has happened where she was laying down, exhaustion, pale, cold Drinks a lot of water, juice- doesn't drink a lot of caffeine or salt.   Heart murmur as a child. When it happens once, it is more likely to happen a few more times that day. Denies hx of diabetes. Has had a decreased appetite and some nausea recently but doesn't think it is low Blood sugar.  Having some feet swelling bilaterally with occasional tingling. Generalized weakness during spells.  Pt advised to be evaluated in the ED. Advised to increase her fluid intake and salt intake, monitor her BP and HR, and possibly wear some compression stockings on her feet. Discussed possibility of POTS or autonomic dysfunction. She is aware to call back after evaluation to  scheduled HFU.   1. DESCRIPTION: Describe your dizziness.     Light-headed to the point of blacking out.  2. LIGHTHEADED: Do you feel lightheaded? (e.g., somewhat faint, woozy, weak upon standing)     Blacks out sometimes from it- has fallen  3. VERTIGO: Do you feel like either you or the room is spinning or tilting? (i.e., vertigo)     Some spinning but mostly light headed 4. SEVERITY: How bad is it?  Do you feel like you are going to faint? Can you stand and walk?     Has fainted and fallen and  5. ONSET:  When did the dizziness begin?     About 4 months ago 6. AGGRAVATING FACTORS: Does anything make it worse? (e.g., standing, change in head position)     Sitting to standing, and standing for a long time.  7. HEART RATE: Can you tell me your heart rate? How many beats in 15 seconds?  (Note: Not all patients can do this.)       Doesn't track it but will start with her apple watch 8. CAUSE: What do you think is causing the dizziness? (e.g., decreased fluids or food, diarrhea, emotional distress, heat exposure, new medicine, sudden standing, vomiting; unknown)     Question POTS 9. RECURRENT SYMPTOM: Have you had dizziness before? If Yes, ask: When was the last time? What happened that time?     Off an on for the last few months  10. OTHER SYMPTOMS: Do you have any other symptoms? (e.g., fever, chest pain, vomiting, diarrhea, bleeding)  Feet swelling, decreased appetite, stomach cramping.  11. PREGNANCY: Is there any chance you are pregnant? When was your last menstrual period?       Has been trying- period is supposed to starting in one week  Answer Assessment - Initial Assessment Questions Swelling bilateral ankles in the crease of the foot shin. Feels real dry Pale/ bruising green one time when she was in a cold area and her feet were both very hot. Denies any hot hard knots, denies pain.  Elevating feet, avoiding crossing legs Denies medication  use.  1. LOCATION: Which ankle is swollen? Where is the swelling?     Bilateral feet 2. ONSET: When did the swelling start?     A couple weeks 3. SWELLING: How bad is the swelling? Or, How large is it? (e.g., mild, moderate, severe; size of localized swelling)      Entire foot but the swelling and pressure is worst is on the top and outside.  4. PAIN: Is there any pain? If Yes, ask: How bad is it? (Scale 0-10; or none, mild, moderate, severe)     Uncomfortable 5. CAUSE: What do you think caused the ankle swelling?     unsure 6. OTHER SYMPTOMS: Do you have any other symptoms? (e.g., fever, chest pain, difficulty breathing, calf pain)     Feet tingling  7. PREGNANCY: Is there any chance you are pregnant? When was your last menstrual period?     Has been trying- period due in one week.  Protocols used: Dizziness - Lightheadedness-A-AH, Ankle Swelling-A-AH

## 2024-06-01 ENCOUNTER — Emergency Department

## 2024-06-01 ENCOUNTER — Emergency Department
Admission: EM | Admit: 2024-06-01 | Discharge: 2024-06-01 | Disposition: A | Discharge status: Home / Self Care | Source: Ambulatory Visit | Attending: Emergency Medicine | Admitting: Emergency Medicine

## 2024-06-01 DIAGNOSIS — Z3202 Encounter for pregnancy test, result negative: Secondary | ICD-10-CM | POA: Diagnosis not present

## 2024-06-01 DIAGNOSIS — R634 Abnormal weight loss: Secondary | ICD-10-CM | POA: Diagnosis not present

## 2024-06-01 DIAGNOSIS — R42 Dizziness and giddiness: Secondary | ICD-10-CM | POA: Diagnosis not present

## 2024-06-01 DIAGNOSIS — R079 Chest pain, unspecified: Secondary | ICD-10-CM | POA: Diagnosis not present

## 2024-06-01 DIAGNOSIS — R55 Syncope and collapse: Secondary | ICD-10-CM | POA: Insufficient documentation

## 2024-06-01 DIAGNOSIS — Z0389 Encounter for observation for other suspected diseases and conditions ruled out: Secondary | ICD-10-CM | POA: Diagnosis not present

## 2024-06-01 DIAGNOSIS — M25471 Effusion, right ankle: Secondary | ICD-10-CM | POA: Diagnosis not present

## 2024-06-01 DIAGNOSIS — M25472 Effusion, left ankle: Secondary | ICD-10-CM | POA: Diagnosis not present

## 2024-06-01 LAB — COMPREHENSIVE METABOLIC PANEL WITH GFR
ALT: 10 U/L (ref 0–44)
AST: 16 U/L (ref 15–41)
Albumin: 4.7 g/dL (ref 3.5–5.0)
Alkaline Phosphatase: 47 U/L (ref 38–126)
Anion gap: 11 (ref 5–15)
BUN: 11 mg/dL (ref 6–20)
CO2: 24 mmol/L (ref 22–32)
Calcium: 9.6 mg/dL (ref 8.9–10.3)
Chloride: 101 mmol/L (ref 98–111)
Creatinine, Ser: 0.71 mg/dL (ref 0.44–1.00)
GFR, Estimated: >60 mL/min (ref >60)
Glucose, Bld: 88 mg/dL (ref 70–99)
Potassium: 3.7 mmol/L (ref 3.5–5.1)
Sodium: 136 mmol/L (ref 135–145)
Total Bilirubin: 0.5 mg/dL (ref 0.0–1.2)
Total Protein: 8.7 g/dL — ABNORMAL HIGH (ref 6.5–8.1)

## 2024-06-01 LAB — URINALYSIS, ROUTINE W REFLEX MICROSCOPIC
Bilirubin Urine: NEGATIVE
Glucose, UA: NEGATIVE mg/dL
Hgb urine dipstick: NEGATIVE
Ketones, ur: NEGATIVE mg/dL
Leukocytes,Ua: NEGATIVE
Nitrite: NEGATIVE
Protein, ur: NEGATIVE mg/dL
Specific Gravity, Urine: 1.019 (ref 1.005–1.030)
pH: 6 (ref 5.0–8.0)

## 2024-06-01 LAB — CBC
HCT: 40.1 % (ref 36.0–46.0)
Hemoglobin: 13.4 g/dL (ref 12.0–15.0)
MCH: 29.6 pg (ref 26.0–34.0)
MCHC: 33.4 g/dL (ref 30.0–36.0)
MCV: 88.5 fL (ref 80.0–100.0)
Platelets: 345 K/uL (ref 150–400)
RBC: 4.53 MIL/uL (ref 3.87–5.11)
RDW: 13.8 % (ref 11.5–15.5)
WBC: 9.5 K/uL (ref 4.0–10.5)
nRBC: 0 % (ref 0.0–0.2)

## 2024-06-01 LAB — POC URINE PREG, ED: Preg Test, Ur: NEGATIVE

## 2024-06-01 NOTE — Discharge Instructions (Addendum)
 Please follow-up closely with our cardiology department and think that they would be an excellent source to further evaluate including possibly heart monitoring.  Return to the ER right away if develop chest pain passout, fever abdominal pain or other concerning symptoms arise.  I do recommend that you not do any dangerous activities where if you were to become lightheaded or faint you could become injured.

## 2024-06-01 NOTE — ED Triage Notes (Signed)
 Pt presents to the ED via POV from home for near syncopal episodes. Pt states that this has been going on for about two months. States that she gets lightheaded, cold, weak and dizzy during these episodes. Pt states that her PCP told her to go to UC and UC sent here her. Pt last had an episode two weeks ago.

## 2024-06-01 NOTE — ED Provider Notes (Signed)
 [facility] Provider Note    Event Date/Time   First MD Initiated Contact with Patient 06/01/24 1741     (approximate)   History   Near Syncope   HPI  Nancy Bullock is a 23 y.o. female here for evaluation of having episodes of lightheadedness.  For several months has had very brief occasions where she will feel like she will start to feel a bit faint, then will get a strange feeling like blood rushing down to her feet.  It goes away after only a few seconds to a minute.  She has never fainted but it comes and goes.  Sometimes happening when she is sitting at work.  It does not come about necessarily with walking or exertion.  She does not have a history of any major medical issues and not take any estrogen  She has no leg swelling, no history of blood clots no recent long trips or travel with exception to going to [place]  but symptoms have been present before then  There is no chest pain there is no racing heart or palpitations.  She went to urgent care today to be evaluated and they referred her here  She has not aunt who died at a young age but the cause is not known.  She reports her mom is not known to have any significant heart condition     Physical Exam   Triage Vital Signs: ED Triage Vitals  Encounter Vitals Group     BP 06/01/24 1610 (!) 137/111     Girls Systolic BP Percentile --      Girls Diastolic BP Percentile --      Boys Systolic BP Percentile --      Boys Diastolic BP Percentile --      Pulse Rate 06/01/24 1610 81     Resp 06/01/24 1610 18     Temp 06/01/24 1610 98.6 F (37 C)     Temp Source 06/01/24 1610 Oral     SpO2 06/01/24 1610 100 %     Weight 06/01/24 1612 175 lb (79.4 kg)     Height 06/01/24 1612 5' 6 (1.676 m)     Head Circumference --      Peak Flow --      Pain Score 06/01/24 1612 0     Pain Loc --      Pain Education --      Exclude from Growth Chart --     Most recent vital signs: Vitals:   06/01/24 1610  06/01/24 1850  BP: (!) 137/111 127/84  Pulse: 81 81  Resp: 18 16  Temp: 98.6 F (37 C) 97.6 F (36.4 C)  SpO2: 100% 100%     General: Awake, no distress.  Very pleasant ambulates without difficulty CV:  Good peripheral perfusion.  Normal tones and rate Resp:  Normal effort.  Clear bilateral Abd:  No distention.  Soft nontender nondistended Other:  Warm well-perfused all extremities.  No murmurs.  She is fully alert   ED Results / Procedures / Treatments   Labs (all labs ordered are listed, but only abnormal results are displayed) Labs Reviewed  COMPREHENSIVE METABOLIC PANEL WITH GFR - Abnormal; Notable for the following components:      Result Value   Total Protein 8.7 (*)    All other components within normal limits  URINALYSIS, ROUTINE W REFLEX MICROSCOPIC - Abnormal; Notable for the following components:   Color, Urine YELLOW (*)    APPearance CLEAR (*)  All other components within normal limits  CBC  POC URINE PREG, ED     EKG  And inter by me at 1615 heart rate 85 QRS 90 QTc 420 No acute ischemic abnormality.  QT normal.  Slight artifact.  Sinus rhythm    RADIOLOGY  Chest x-ray inter by me is clear, no evidence of cardiomegaly  DG Chest 2 View Result Date: 06/01/2024 EXAM: 2 VIEW(S) XRAY OF THE CHEST 06/01/2024 06:11:00 PM COMPARISON: None available. CLINICAL HISTORY: eval for cardiomegaly FINDINGS: LUNGS AND PLEURA: No focal pulmonary opacity. No pulmonary edema. No pleural effusion. No pneumothorax. HEART AND MEDIASTINUM: No acute abnormality of the cardiac and mediastinal silhouettes. BONES AND SOFT TISSUES: No acute osseous abnormality. IMPRESSION: 1. No acute cardiopulmonary process. Electronically signed by: Maude Stammer MD 06/01/2024 06:41 PM EST RP Workstation: [id number]      PROCEDURES:  Critical Care performed: No  Procedures   MEDICATIONS ORDERED IN ED: Medications - No data to display   IMPRESSION / MDM / ASSESSMENT AND PLAN / ED  COURSE  I reviewed the triage vital signs and the nursing notes.                              Differential diagnosis includes, but is not limited to, possible orthostatic episodes, vasovagal, POTS, anemia, cardiac arrhythmias though this seems quite unlikely, PE, neurologic though no obvious seizure-like activity, etc.  Differential diagnosis being quite broad  Her workup thus far very reassuring.  ECG reassuring, occasional sinus arrhythmia no heart block.  No evidence of prolonged QT Brugada or obvious congenital abnormality noted on ECG.  She has no chest pain or shortness of breath.  She has no risk factors for thromboembolism      Pulmonary Embolism Rule-out Criteria (PERC rule)                        If YES to ANY of the following, the PERC rule is not satisfied and cannot be used to rule out PE in this patient (consider d-dimer or imaging depending on pre-test probability).                      If NO to ALL of the following, AND the clinician's pre-test probability is <15%, the [employer] rule is satisfied and there is no need for further workup (including no need to obtain a d-dimer) as the post-test probability of pulmonary embolism is <2%.                      Mnemonic is HAD CLOTS   H - hormone use (exogenous estrogen)      No. A - age > 50                                                 No. D - DVT/PE history                                      No.   C - coughing blood (hemoptysis)                 No. L - leg swelling, unilateral  No. O - O2 Sat on Room Air < 95%                  No. T - tachycardia (HR >= 100)                         No. S - surgery or trauma, recent                      No.   Based on my evaluation of the patient, including application of this decision instrument, further testing to evaluate for pulmonary embolism is not indicated at this time.    Patient's presentation is most consistent with acute complicated illness / injury  requiring diagnostic workup.    Labs reassuring.  Normal CBC and metabolic panel.  Discussed with patient, recommended follow-up with cardiology.  She will take interim her precautions in the event she were to become lightheaded or possibly faint where she would not be injured.  I think at this point she is medically stable for outpatient evaluation and no doubt.  Her workup here quite reassuring.  No symptoms of a be suggestive of ACS cardiac ischemia.  Her chest x-ray shows no cardiomegaly.  She has no acute pulmonary symptoms.  CBC normal.  Given the nature of it more positional or vasovagal orthostatic seems most likely.  Patient will follow-up with cardiology and PCP.  Return precautions and treatment recommendations and follow-up discussed with the patient who is agreeable with the plan.   FINAL CLINICAL IMPRESSION(S) / ED DIAGNOSES   Final diagnoses:  Near syncope   [facility] records reviewed, sent to the ER for evaluation of near syncopal episodes.  Results for orders placed or performed in visit on 06/01/24  POCT urinalysis dipstick manually resulted  Component Result  Color, UA Yellow  Clarity, UA Clear  Glucose, UA Negative  Bilirubin, UA Undetected  Ketones, UA Negative  Spec Grav, UA 1.015  POCT Blood UA Negative  pH, UA 7.0  Protein, UA Negative  Urobilinogen, UA Negative  Comment: 0.2mg /dl  Leukocytes, UA Negative  Nitrite, UA Negative  POCT pregnancy, urine manually resulted  Component Result  Preg Test, Ur Negative  Internal Quality Control Pass   Rx / DC Orders   ED Discharge Orders          Ordered    Ambulatory referral to Cardiology       Comments: Near syncopal episodes, heart monitor, ? Orthostatic or POTS   06/01/24 1800             Note:  This document was prepared using Dragon voice recognition software and may include unintentional dictation errors.   Dicky Anes, MD 06/01/24 2024

## 2024-06-10 ENCOUNTER — Ambulatory Visit

## 2024-06-10 ENCOUNTER — Ambulatory Visit (INDEPENDENT_AMBULATORY_CARE_PROVIDER_SITE_OTHER)

## 2024-06-10 ENCOUNTER — Encounter: Payer: Self-pay | Admitting: Cardiology

## 2024-06-10 ENCOUNTER — Ambulatory Visit: Attending: Cardiology | Admitting: Cardiology

## 2024-06-10 VITALS — BP 121/85 | HR 82 | Ht 66.0 in | Wt 173.6 lb

## 2024-06-10 DIAGNOSIS — R42 Dizziness and giddiness: Secondary | ICD-10-CM | POA: Diagnosis not present

## 2024-06-10 DIAGNOSIS — R011 Cardiac murmur, unspecified: Secondary | ICD-10-CM | POA: Insufficient documentation

## 2024-06-10 DIAGNOSIS — R55 Syncope and collapse: Secondary | ICD-10-CM | POA: Insufficient documentation

## 2024-06-10 DIAGNOSIS — R600 Localized edema: Secondary | ICD-10-CM | POA: Diagnosis not present

## 2024-06-10 NOTE — Progress Notes (Signed)
 Cardiology Office Note   Date:  06/10/2024  ID:  Nancy Bullock, DOB 2000/12/12, MRN [id number] PCP: Dineen Channel, PA-C  [facility] Providers Cardiologist:  None     History of Present Illness Nancy Bullock is a 23 y.o. female with a past medical history of anxiety, asthma, depression, GERD, and remote history of heart murmur, who is here today to establish care after recent emergency department visit for near syncopal episode.  She has had several urgent care and emergency department visits thus far this year.  She was last evaluated in [facility] emergency department 06/18/2024 for lightheadedness.  She states for several months she has been having brief occasions where she felt like she was starting to feel a bit faint and weak and a strange feeling like blood rushing down to her feet.  He goes away only after a few seconds to a minute.  She has never fainted or completely passed out.  Sometimes it happens when she is sitting at work.  He does not, but without necessarily with walking or exertion.  Her labs were normal.  Chest x-ray without cardiomegaly.  No acute pulmonary symptoms.  Given the nature of more positional or vasovagal orthostasis she was recommended to follow-up with cardiology and her PCP.  She presents to clinic today with continued concerns of dizziness lightheadedness and near syncope.  She states that she has also had swelling to her knees and lower extremities and is noted as of late an uptake in her blood pressure.  She says she has been suffering from hot flashes associated with her lightheadedness and dizziness.  She states there is not a lot of family history that she is aware of but she does know that her mother's half-sister died of a congenital heart defect.  She said that she did also have a heart murmur when she was a child and she stayed overnight in the hospital for several days and had testing completed when they were monitoring her sleeping she said she may  have been 8 or 9.  She recently was evaluated by urgent care for near syncope and was sent to the emergency department.  Workup was unrevealing in the emergency department and she was given a referral to follow-up with cardiology.  She stated she was also advised in the emergency department that there may be a possibility that she had POTS but through her research on the Internet and on TikTok she states that she is definitely does not suffer from those type of symptoms.  She states that she did have syncopal episodes several months back where she completely blacked out but it was for split-second and then she came back to.  Since then with any of the dizziness or lightheadedness that she immediately sits down to prevent any further syncopal episodes.  ROS: 10 point review of system has been reviewed and considered negative the exception was been listed in the HPI  Studies Reviewed EKG Interpretation Date/Time:  Wednesday June 10 2024 14:49:31 EST Ventricular Rate:  79 PR Interval:  168 QRS Duration:  94 QT Interval:  368 QTC Calculation: 421 R Axis:   55  Text Interpretation: Sinus rhythm with marked sinus arrhythmia When compared with ECG of 01-Jun-2024 16:13, No significant change was found Confirmed by Gerard Frederick ([id number]) on 06/10/2024 2:54:01 PM     Risk Assessment/Calculations           Physical Exam VS:  BP 121/85 (BP Location: Right Arm, Cuff Size: Normal)  Pulse 82   LMP 05/18/2024   SpO2 99%        Wt Readings from Last 3 Encounters:  06/01/24 175 lb (79.4 kg)  03/03/24 200 lb (90.7 kg)  12/08/23 205 lb (93 kg)    GEN: Well nourished, well developed in no acute distress NECK: No JVD; No carotid bruits CARDIAC: RRR, I/VI systolic murmur without rubs or gallops RESPIRATORY:  Clear to auscultation without rales, wheezing or rhonchi  ABDOMEN: Soft, non-tender, non-distended EXTREMITIES:  No edema; No deformity   ASSESSMENT AND PLAN Near syncope/lightheadedness  where she was recently evaluated in the emergency department with an unrevealing workup.  Orthostatic vitals were completed and she is not orthostatic or exhibiting symptoms of POTS as there is no tachycardia.  EKG today revealed sinus rhythm with sinus arrhythmia with a rate of 79.  For continued lightheadedness dizziness near syncope and a history of several months ago of a syncopal episode she has been placed on a ZIO AT for 2 weeks to rule out any arrhythmia or pauses.  She is also being scheduled for an echocardiogram to rule out any functional or structural abnormalities.  Heart murmur with a family history of congenital defects where she has been scheduled for an echocardiogram to rule out any functional or structural abnormalities.  Peripheral edema primarily around the ankles.  She states that she sits and stands primarily at work.  She has been encouraged to wear compression stockings and participate in conservative therapy.       Dispo: Patient to return to clinic to see MD/APP in 6 to 8 weeks or sooner if needed for further evaluation  Signed, Avyukth Bontempo, NP

## 2024-06-10 NOTE — Patient Instructions (Signed)
 Medication Instructions:  Your physician recommends that you continue on your current medications as directed. Please refer to the Current Medication list given to you today.   *If you need a refill on your cardiac medications before your next appointment, please call your pharmacy*  Lab Work: No labs ordered today  If you have labs (blood work) drawn today and your tests are completely normal, you will receive your results only by: MyChart Message (if you have MyChart) OR A paper copy in the mail If you have any lab test that is abnormal or we need to change your treatment, we will call you to review the results.  Testing/Procedures: Your physician has requested that you have an echocardiogram. Echocardiography is a painless test that uses sound waves to create images of your heart. It provides your doctor with information about the size and shape of your heart and how well your heart's chambers and valves are working.   You may receive an ultrasound enhancing agent through an IV if needed to better visualize your heart during the echo. This procedure takes approximately one hour.  There are no restrictions for this procedure.  This will take place at [address] [facility] [address], [place] [address]  Please note: We ask at that you not bring children with you during ultrasound (echo/ vascular) testing. Due to room size and safety concerns, children are not allowed in the ultrasound rooms during exams. Our front office staff cannot provide observation of children in our lobby area while testing is being conducted. An adult accompanying a patient to their appointment will only be allowed in the ultrasound room at the discretion of the ultrasound technician under special circumstances. We apologize for any inconvenience.   ZIO XT- Long Term Monitor Instructions  Your physician has requested you wear a ZIO patch monitor for 14 days.  This is a single patch monitor. [employer]  supplies one patch monitor per enrollment. Additional stickers are not available. Please do not apply patch if you will be having a Nuclear Stress Test,  Echocardiogram, Cardiac CT, MRI, or Chest Xray during the period you would be wearing the  monitor. The patch cannot be worn during these tests. You cannot remove and re-apply the  ZIO XT patch monitor.  Your ZIO patch monitor will be mailed 3 day USPS to your address on file. It may take 3-5 days  to receive your monitor after you have been enrolled.  Once you have received your monitor, please review the enclosed instructions. Your monitor  has already been registered assigning a specific monitor serial # to you.  Billing and Patient Assistance Program Information  We have supplied [employer] with any of your insurance information on file for billing purposes. [employer] offers a sliding scale Patient Assistance Program for patients that do not have  insurance, or whose insurance does not completely cover the cost of the ZIO monitor.  You must apply for the Patient Assistance Program to qualify for this discounted rate.  To apply, please call [employer] at [phone], select option 4, select option 2, ask to apply for  Patient Assistance Program. Meredeth will ask your household income, and how many people  are in your household. They will quote your out-of-pocket cost based on that information.  [employer] will also be able to set up a 78-month, interest-free payment plan if needed.  Applying the monitor   Shave hair from upper left chest.  Hold abrader disc by orange tab. Rub abrader in  40 strokes over the upper left chest as  indicated in your monitor instructions.  Clean area with 4 enclosed alcohol pads. Let dry.  Apply patch as indicated in monitor instructions. Patch will be placed under collarbone on left  side of chest with arrow pointing upward.  Rub patch adhesive wings for 2 minutes. Remove white label marked 1. Remove the white   label marked 2. Rub patch adhesive wings for 2 additional minutes.  While looking in a mirror, press and release button in center of patch. A small green light will  flash 3-4 times. This will be your only indicator that the monitor has been turned on.  Do not shower for the first 24 hours. You may shower after the first 24 hours.  Press the button if you feel a symptom. You will hear a small click. Record Date, Time and  Symptom in the Patient Logbook.  When you are ready to remove the patch, follow instructions on the last 2 pages of Patient  Logbook. Stick patch monitor onto the last page of Patient Logbook.  Place Patient Logbook in the blue and white box. Use locking tab on box and tape box closed  securely. The blue and white box has prepaid postage on it. Please place it in the mailbox as  soon as possible. Your physician should have your test results approximately 7 days after the  monitor has been mailed back to [facility].  Call [facility] Customer Care at [phone] if you have questions regarding  your ZIO XT patch monitor. Call them immediately if you see an orange light blinking on your  monitor.  If your monitor falls off in less than 4 days, contact our Monitor department at [phone].  If your monitor becomes loose or falls off after 4 days call [employer] at [phone] for  suggestions on securing your monitor   Follow-Up: At [facility], you and your health needs are our priority.  As part of our continuing mission to provide you with exceptional heart care, our providers are all part of one team.  This team includes your primary Cardiologist (physician) and Advanced Practice Providers or APPs (Physician Assistants and Nurse Practitioners) who all work together to provide you with the care you need, when you need it.  Your next appointment:   6 - 8 week(s)  Provider:   Tylene Lunch, NP

## 2024-07-06 ENCOUNTER — Ambulatory Visit: Payer: Self-pay | Attending: Cardiology

## 2024-07-09 ENCOUNTER — Ambulatory Visit: Payer: Self-pay | Admitting: Cardiology

## 2024-07-09 DIAGNOSIS — R55 Syncope and collapse: Secondary | ICD-10-CM

## 2024-07-09 NOTE — Progress Notes (Signed)
 Average heart rate 84 bpm, rare PACs or early beats.  There were 5 patient triggers and 1 diary entry which corresponded with sinus rhythm.  There were no arrhythmias that were noted.  Overall benign monitor.

## 2024-08-05 ENCOUNTER — Ambulatory Visit: Payer: Self-pay | Admitting: Cardiology

## 2024-08-17 ENCOUNTER — Ambulatory Visit: Payer: Self-pay
